# Patient Record
Sex: Female | Born: 1970 | Race: Black or African American | Marital: Married
Health system: Northeastern US, Academic
[De-identification: ages and names within clinical notes are randomized; demographics above are authoritative.]

---

## 2003-08-22 ENCOUNTER — Encounter: Payer: Self-pay | Admitting: Cardiology

## 2013-04-09 IMAGING — CR DG CHEST 2V
2 series · 2 of 2 positions shown · non-contrast
Comparison: None.

CLINICAL DATA: Dizziness, visual change

CHEST - 2 VIEW

[w chest pa]
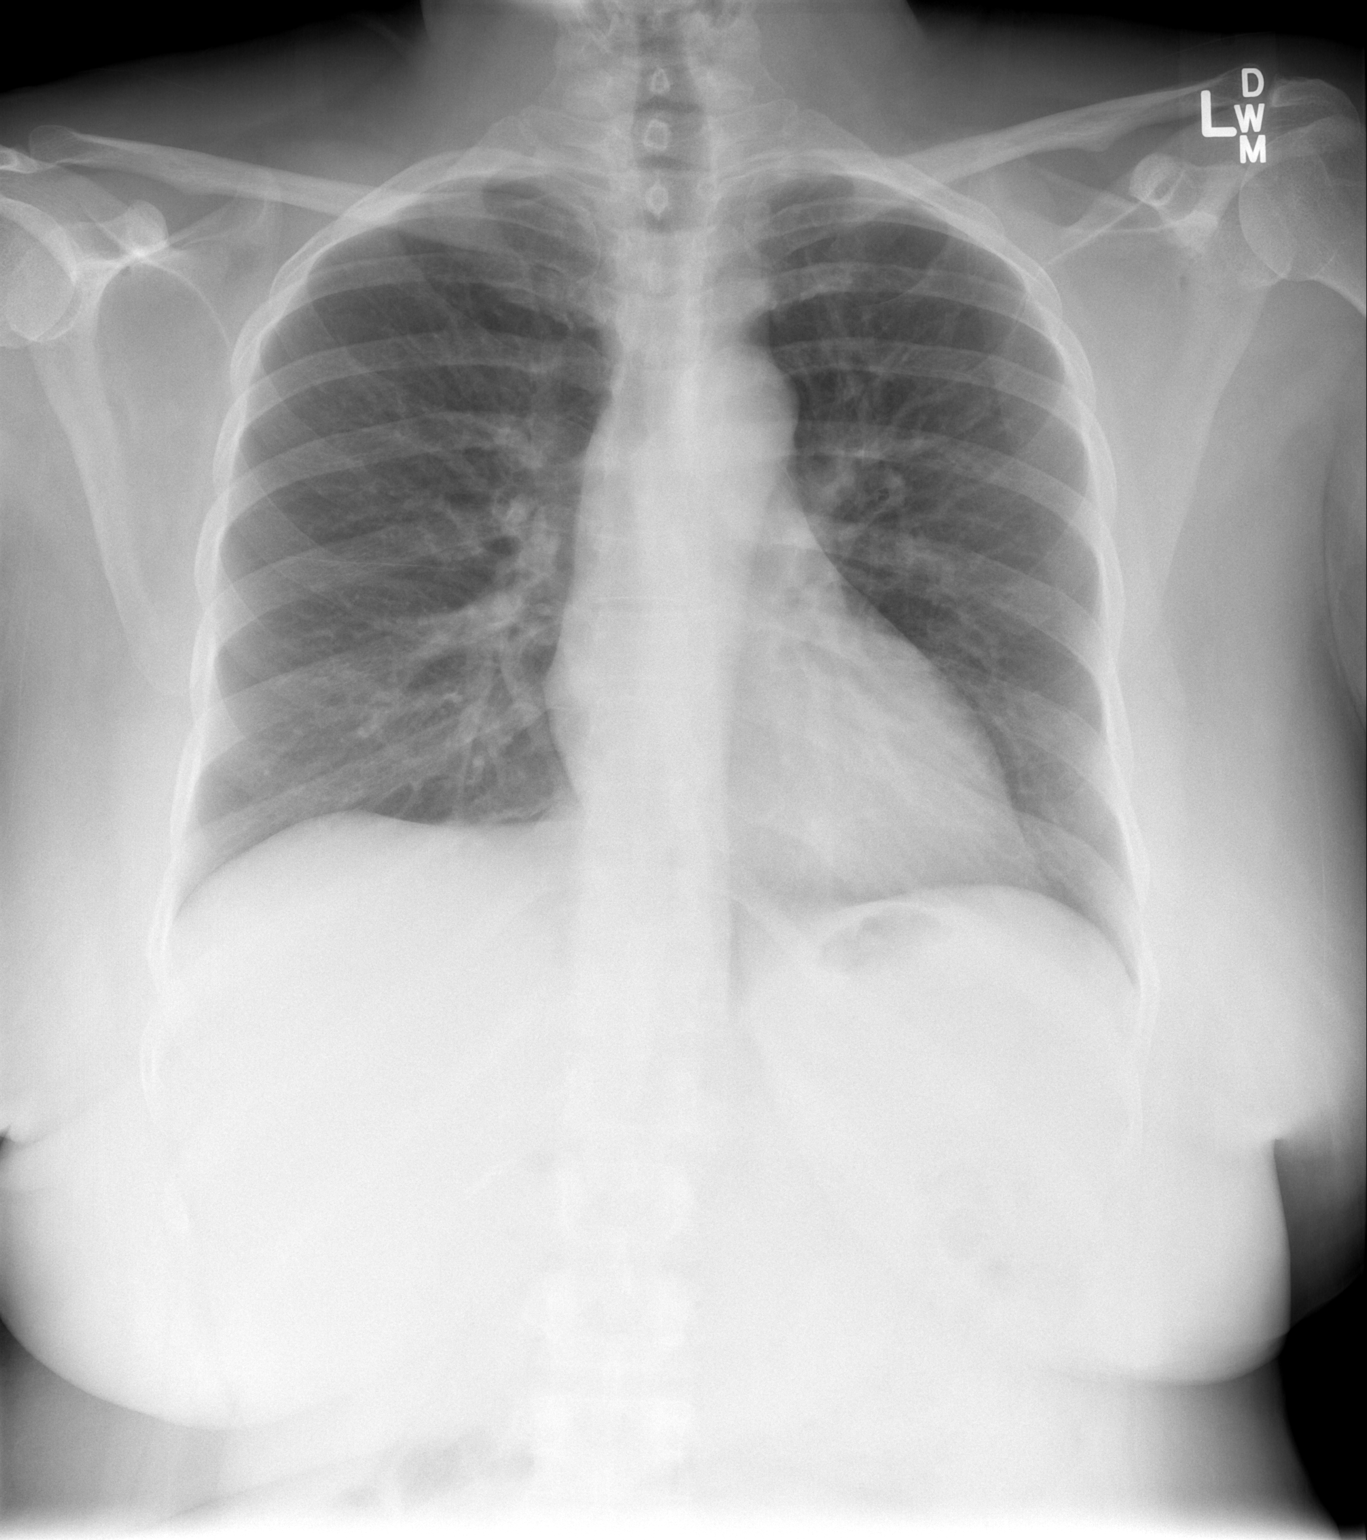

[w chest lat]
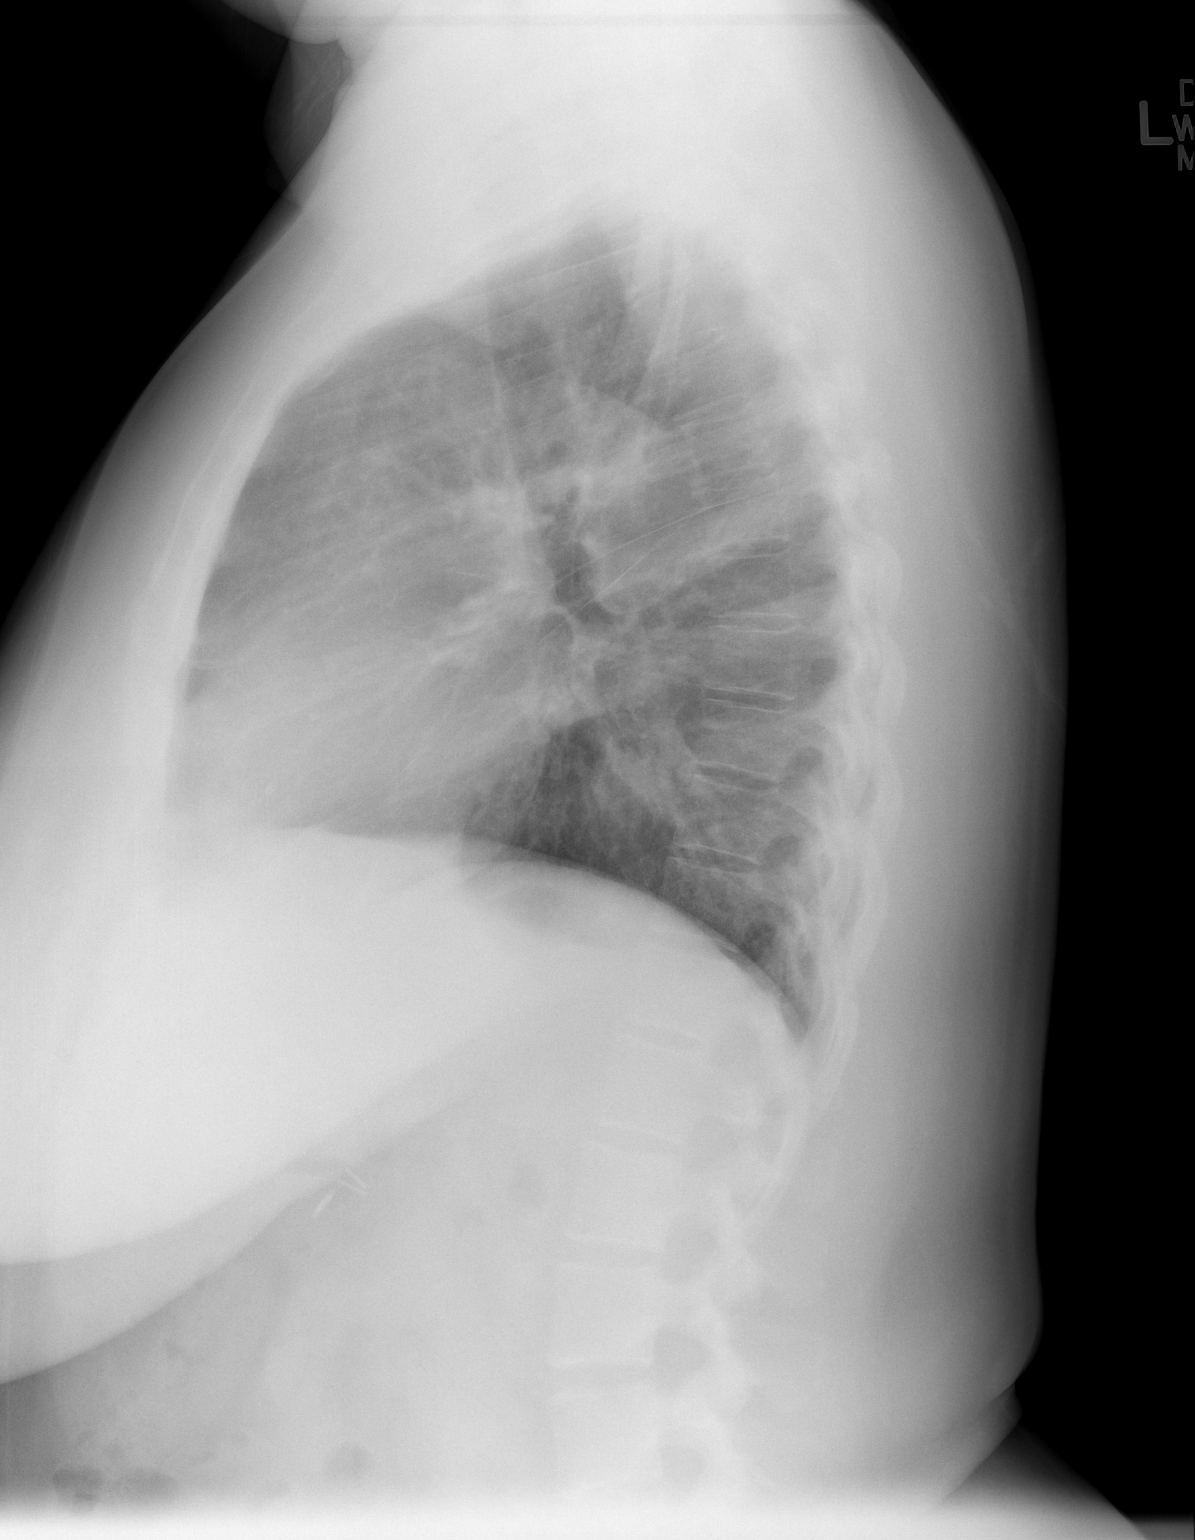

[2 of 2 positions shown; findings below may reference images not displayed]

FINDINGS: Normal mediastinum and cardiac silhouette.  Normal
pulmonary  vasculature.  No evidence of effusion, infiltrate, or
pneumothorax.  No acute bony abnormality.
IMPRESSION: No acute cardiopulmonary process.

## 2021-10-01 ENCOUNTER — Other Ambulatory Visit: Payer: Self-pay

## 2021-10-01 ENCOUNTER — Emergency Department
Admission: EM | Admit: 2021-10-01 | Discharge: 2021-10-02 | Disposition: A | Discharge status: Home / Self Care | Payer: Medicare (Managed Care) | Source: Ambulatory Visit | Attending: Emergency Medicine | Admitting: Emergency Medicine

## 2021-10-01 ENCOUNTER — Encounter: Payer: Self-pay | Admitting: Emergency Medicine

## 2021-10-01 DIAGNOSIS — E1122 Type 2 diabetes mellitus with diabetic chronic kidney disease: Secondary | ICD-10-CM | POA: Insufficient documentation

## 2021-10-01 DIAGNOSIS — I120 Hypertensive chronic kidney disease with stage 5 chronic kidney disease or end stage renal disease: Secondary | ICD-10-CM | POA: Insufficient documentation

## 2021-10-01 DIAGNOSIS — N186 End stage renal disease: Secondary | ICD-10-CM | POA: Insufficient documentation

## 2021-10-01 DIAGNOSIS — Z992 Dependence on renal dialysis: Secondary | ICD-10-CM

## 2021-10-01 DIAGNOSIS — R42 Dizziness and giddiness: Secondary | ICD-10-CM

## 2021-10-01 LAB — CO2, PLASMA: CO2,Plasma: 21 mmol/L (ref 20–28)

## 2021-10-01 LAB — MAGNESIUM: Magnesium: 2.1 mg/dL (ref 1.6–2.5)

## 2021-10-01 LAB — BASIC METABOLIC PANEL
Calcium: 8.5 mg/dL — ABNORMAL LOW (ref 8.6–10.2)
Chloride: 100 mmol/L (ref 96–108)
Creatinine: 7.76 mg/dL — ABNORMAL HIGH (ref 0.51–0.95)
Glucose: 84 mg/dL (ref 60–99)
Lab: 53 mg/dL — ABNORMAL HIGH (ref 6–20)
Sodium: 137 mmol/L (ref 133–145)
eGFR BY CREAT: 6 * — AB

## 2021-10-01 LAB — CBC AND DIFFERENTIAL
Baso # K/uL: 0.1 10*3/uL (ref 0.0–0.1)
Basophil %: 0.5 %
Eos # K/uL: 0.2 10*3/uL (ref 0.0–0.4)
Eosinophil %: 1.7 %
Hematocrit: 39 % (ref 34–45)
Hemoglobin: 12 g/dL (ref 11.2–15.7)
IMM Granulocytes #: 0 10*3/uL (ref 0.0–0.0)
IMM Granulocytes: 0.4 %
Lymph # K/uL: 4.8 10*3/uL — ABNORMAL HIGH (ref 1.2–3.7)
Lymphocyte %: 46.7 %
MCH: 28 pg (ref 26–32)
MCHC: 31 g/dL — ABNORMAL LOW (ref 32–36)
MCV: 92 fL (ref 79–95)
Mono # K/uL: 0.8 10*3/uL (ref 0.2–0.9)
Monocyte %: 7.9 %
Neut # K/uL: 4.4 10*3/uL (ref 1.6–6.1)
Nucl RBC # K/uL: 0 10*3/uL (ref 0.0–0.0)
Nucl RBC %: 0 /100 WBC (ref 0.0–0.2)
Platelets: 248 10*3/uL (ref 160–370)
RBC: 4.2 MIL/uL (ref 3.9–5.2)
RDW: 16.7 % — ABNORMAL HIGH (ref 11.7–14.4)
Seg Neut %: 42.8 %
WBC: 10.2 10*3/uL — ABNORMAL HIGH (ref 4.0–10.0)

## 2021-10-01 LAB — RENAL FUNCTION PANEL
Albumin: 3.8 g/dL (ref 3.5–5.2)
Anion Gap: 14 (ref 7–16)
CO2: 23 mmol/L (ref 20–28)
Calcium: 8.3 mg/dL — ABNORMAL LOW (ref 8.6–10.2)
Chloride: 104 mmol/L (ref 96–108)
Creatinine: 8.19 mg/dL — ABNORMAL HIGH (ref 0.51–0.95)
Glucose: 131 mg/dL — ABNORMAL HIGH (ref 60–99)
Lab: 54 mg/dL — ABNORMAL HIGH (ref 6–20)
Phosphorus: 5.1 mg/dL — ABNORMAL HIGH (ref 2.7–4.5)
Potassium: 4.5 mmol/L (ref 3.3–5.1)
Sodium: 141 mmol/L (ref 133–145)
eGFR BY CREAT: 6 * — AB

## 2021-10-01 LAB — PHOSPHORUS

## 2021-10-01 LAB — POCT GLUCOSE
Glucose POCT: 81 mg/dL (ref 60–99)
Glucose POCT: 115 mg/dL — ABNORMAL HIGH (ref 60–99)

## 2021-10-01 LAB — PHOSPHORUS , PLASMA

## 2021-10-01 LAB — HOLD GREEN WITH GEL

## 2021-10-01 LAB — POTASSIUM,PLASMA

## 2021-10-01 MED ORDER — AMLODIPINE BESYLATE 10 MG PO TABS *I*
10.0000 mg | ORAL_TABLET | Freq: Once | ORAL | Status: AC
Start: 2021-10-01 — End: 2021-10-01
  Administered 2021-10-01: 10 mg via ORAL
  Filled 2021-10-01: qty 1

## 2021-10-01 MED ORDER — ONDANSETRON HCL 2 MG/ML IV SOLN *I*
INTRAMUSCULAR | Status: DC
Start: 2021-10-01 — End: 2021-10-02
  Filled 2021-10-01: qty 2

## 2021-10-01 MED ORDER — HYDRALAZINE HCL 50 MG PO TABS *I*
100.0000 mg | ORAL_TABLET | Freq: Once | ORAL | Status: AC
Start: 2021-10-01 — End: 2021-10-01
  Administered 2021-10-01: 100 mg via ORAL
  Filled 2021-10-01: qty 2

## 2021-10-01 MED ORDER — BISOPROLOL FUMARATE 5 MG PO TABS *I*
5.0000 mg | ORAL_TABLET | Freq: Once | ORAL | Status: AC
Start: 2021-10-01 — End: 2021-10-01
  Administered 2021-10-01: 5 mg via ORAL
  Filled 2021-10-01: qty 1

## 2021-10-01 MED ORDER — ONDANSETRON HCL 2 MG/ML IV SOLN *I*
4.0000 mg | Freq: Once | INTRAMUSCULAR | Status: AC
Start: 2021-10-01 — End: 2021-10-01
  Administered 2021-10-01: 4 mg via INTRAVENOUS
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
## 2021-10-02 LAB — HEPATITIS B PROF
HBV Core Ab: NEGATIVE
HBV S Ab Quant: >1000 m[IU]/mL
HBV S Ab: POSITIVE
HBV S Ag: NEGATIVE

## 2021-10-02 NOTE — Progress Notes (Signed)
Report Given To  10/01/21  Report given to ED nurse  The patient received 2 hours of HD treatment.Net Uf 1.5l.  Bp was high on arrival,pt reported nausea,headache.ED provider and [facility] notified.BP improved with HD and meds.  No complains reported post HD tx             Descriptive Sentence / Reason for Admission   Duration of Treatment (minutes): 120 minutes  Complications: Hypertensive,BP meds given.  Difficulties cannulating AVF,pulled clots from the middle portion of AVF.  Cramping last 30 min of HD,cramps relieved with NS bolus and decreased UF.        Active Issues / Relevant Events            Review of medications administered  such as routine meds, antibiotics, and heparin: Zofran 4mg ,Amlodipine 10mg ,Bisoprolol 5mg ,Hydralazine 100mg   Vital Signs: see HD flow sheets        To Do List  Medications still needing administration: none HD related  Dressing change due : pt is instructed to remove dressing from the Left AVF approximately 6 hours post HD treatment      Anticipatory Guidance / Discharge Planning  Date/Time of Next Treatment: 10/03/21

## 2021-10-02 NOTE — ED Notes (Signed)
Pt was not present in their assigned room after being brought back from dialysis. Writer and staff attempted to locate pt but was unable to. Provider made aware.

## 2022-01-03 DEATH — deceased

## 8386-12-05 DEATH — deceased
